# Patient Record
Sex: Female | Born: 1970 | Race: White | Hispanic: No | Marital: Married | Smoking: Never smoker
Health system: Southern US, Community
[De-identification: ages and names within clinical notes are randomized; demographics above are authoritative.]

## PROBLEM LIST (undated history)

## (undated) DIAGNOSIS — G8929 Other chronic pain: Secondary | ICD-10-CM

## (undated) DIAGNOSIS — R519 Headache, unspecified: Secondary | ICD-10-CM

## (undated) DIAGNOSIS — M4300 Spondylolysis, site unspecified: Secondary | ICD-10-CM

## (undated) HISTORY — DX: Headache, unspecified: R51.9

## (undated) HISTORY — DX: Spondylolysis, site unspecified: M43.00

## (undated) HISTORY — DX: Other chronic pain: G89.29

---

## 1996-01-07 HISTORY — PX: BUNIONECTOMY: SHX129

## 1998-02-14 ENCOUNTER — Ambulatory Visit (HOSPITAL_COMMUNITY): Admission: RE | Admit: 1998-02-14 | Discharge: 1998-02-14 | Payer: Self-pay | Admitting: Obstetrics and Gynecology

## 1998-06-22 ENCOUNTER — Inpatient Hospital Stay (HOSPITAL_COMMUNITY): Admission: AD | Admit: 1998-06-22 | Discharge: 1998-06-24 | Payer: Self-pay | Admitting: Obstetrics & Gynecology

## 2000-12-25 ENCOUNTER — Other Ambulatory Visit: Admission: RE | Admit: 2000-12-25 | Discharge: 2000-12-25 | Payer: Self-pay | Admitting: Obstetrics and Gynecology

## 2002-01-13 ENCOUNTER — Other Ambulatory Visit: Admission: RE | Admit: 2002-01-13 | Discharge: 2002-01-13 | Payer: Self-pay | Admitting: Obstetrics and Gynecology

## 2002-12-12 ENCOUNTER — Other Ambulatory Visit: Admission: RE | Admit: 2002-12-12 | Discharge: 2002-12-12 | Payer: Self-pay | Admitting: Obstetrics and Gynecology

## 2003-06-13 ENCOUNTER — Inpatient Hospital Stay (HOSPITAL_COMMUNITY): Admission: AD | Admit: 2003-06-13 | Discharge: 2003-06-16 | Payer: Self-pay | Admitting: Obstetrics and Gynecology

## 2003-07-25 ENCOUNTER — Other Ambulatory Visit: Admission: RE | Admit: 2003-07-25 | Discharge: 2003-07-25 | Payer: Self-pay | Admitting: Obstetrics and Gynecology

## 2004-05-08 ENCOUNTER — Other Ambulatory Visit: Admission: RE | Admit: 2004-05-08 | Discharge: 2004-05-08 | Payer: Self-pay | Admitting: Obstetrics and Gynecology

## 2004-11-18 ENCOUNTER — Other Ambulatory Visit: Admission: RE | Admit: 2004-11-18 | Discharge: 2004-11-18 | Payer: Self-pay | Admitting: Obstetrics and Gynecology

## 2013-01-06 HISTORY — PX: BUNIONECTOMY: SHX129

## 2014-02-28 ENCOUNTER — Ambulatory Visit (INDEPENDENT_AMBULATORY_CARE_PROVIDER_SITE_OTHER): Payer: 59

## 2014-02-28 DIAGNOSIS — R52 Pain, unspecified: Secondary | ICD-10-CM

## 2014-02-28 DIAGNOSIS — S92301S Fracture of unspecified metatarsal bone(s), right foot, sequela: Secondary | ICD-10-CM

## 2014-02-28 DIAGNOSIS — M778 Other enthesopathies, not elsewhere classified: Secondary | ICD-10-CM

## 2014-02-28 DIAGNOSIS — M7751 Other enthesopathy of right foot: Secondary | ICD-10-CM

## 2014-02-28 DIAGNOSIS — S92301G Fracture of unspecified metatarsal bone(s), right foot, subsequent encounter for fracture with delayed healing: Secondary | ICD-10-CM

## 2014-02-28 DIAGNOSIS — M779 Enthesopathy, unspecified: Secondary | ICD-10-CM

## 2014-02-28 NOTE — Patient Instructions (Signed)
Osteoarthritis Osteoarthritis is a disease that causes soreness and inflammation of a joint. It occurs when the cartilage at the affected joint wears down. Cartilage acts as a cushion, covering the ends of bones where they meet to form a joint. Osteoarthritis is the most common form of arthritis. It often occurs in older people. The joints affected most often by this condition include those in the:  Ends of the fingers.  Thumbs.  Neck.  Lower back.  Knees.  Hips. CAUSES  Over time, the cartilage that covers the ends of bones begins to wear away. This causes bone to rub on bone, producing pain and stiffness in the affected joints.  RISK FACTORS Certain factors can increase your chances of having osteoarthritis, including:  Older age.  Excessive body weight.  Overuse of joints.  Previous joint injury. SIGNS AND SYMPTOMS   Pain, swelling, and stiffness in the joint.  Over time, the joint may lose its normal shape.  Small deposits of bone (osteophytes) may grow on the edges of the joint.  Bits of bone or cartilage can break off and float inside the joint space. This may cause more pain and damage. DIAGNOSIS  Your health care provider will do a physical exam and ask about your symptoms. Various tests may be ordered, such as:  X-rays of the affected joint.  An MRI scan.  Blood tests to rule out other types of arthritis.  Joint fluid tests. This involves using a needle to draw fluid from the joint and examining the fluid under a microscope. TREATMENT  Goals of treatment are to control pain and improve joint function. Treatment plans may include:  A prescribed exercise program that allows for rest and joint relief.  A weight control plan.  Pain relief techniques, such as:  Properly applied heat and cold.  Electric pulses delivered to nerve endings under the skin (transcutaneous electrical nerve stimulation [TENS]).  Massage.  Certain nutritional  supplements.  Medicines to control pain, such as:  Acetaminophen.  Nonsteroidal anti-inflammatory drugs (NSAIDs), such as naproxen.  Narcotic or central-acting agents, such as tramadol.  Corticosteroids. These can be given orally or as an injection.  Surgery to reposition the bones and relieve pain (osteotomy) or to remove loose pieces of bone and cartilage. Joint replacement may be needed in advanced states of osteoarthritis. HOME CARE INSTRUCTIONS   Take medicines only as directed by your health care provider.  Maintain a healthy weight. Follow your health care provider's instructions for weight control. This may include dietary instructions.  Exercise as directed. Your health care provider can recommend specific types of exercise. These may include:  Strengthening exercises. These are done to strengthen the muscles that support joints affected by arthritis. They can be performed with weights or with exercise bands to add resistance.  Aerobic activities. These are exercises, such as brisk walking or low-impact aerobics, that get your heart pumping.  Range-of-motion activities. These keep your joints limber.  Balance and agility exercises. These help you maintain daily living skills.  Rest your affected joints as directed by your health care provider.  Keep all follow-up visits as directed by your health care provider. SEEK MEDICAL CARE IF:   Your skin turns red.  You develop a rash in addition to your joint pain.  You have worsening joint pain.  You have a fever along with joint or muscle aches. SEEK IMMEDIATE MEDICAL CARE IF:  You have a significant loss of weight or appetite.  You have night sweats. FOR MORE  INFORMATION   [employer] and Musculoskeletal and Skin Diseases: www.niams.http://www.myers.net/nih.gov  [employer] Aging: https://walker.com/www.nia.nih.gov  American College of Rheumatology: www.rheumatology.org Document Released: 12/23/2004 Document Revised:  05/09/2013 Document Reviewed: 08/30/2012 [facility] Patient Information 2015 [place], [place]. This information is not intended to replace advice given to you by your health care provider. Make sure you discuss any questions you have with your health care provider.   The fracture is lightly lead to a small amount of osteoarthritis and joint enlargement. This may subside over time, however may remain with some permanent enlargement or swelling of the joints. This can also lead to some stiffness and pain in the joints as well.

## 2014-02-28 NOTE — Progress Notes (Signed)
   Subjective:    Patient ID: Misty Torres, female    DOB: 03-04-1970, 44 y.o.   MRN: [id number]  HPI PT HAVE HISTORY OF BUNIONECTOMY DONE AND FRACTURE ON THE RT FOOT AND STILL HAVING PAIN FOR 9 MONTHS. THE FOOT IS BEEN THE SAME NOT WORSE AND GET AGGRAVATED BY WALKING. TRIED BOOT BUT NO HELP.   Review of Systems  Neurological: Positive for headaches.  All other systems reviewed and are negative.      Objective:   Physical Exam 44 year old female well-developed well-nourished oriented 3 presents this time for second opinion about surgery complication proxy 9 or 10 months ago underwent bunionectomy what appear to be a tailor bunionectomy as well as a scarf or a Z bunionectomy of the first metatarsal with 2 screw fixation subsequent Lee after surgery she developed a fracture the metatarsal base with possibly some displacement posse some loss of correction. There still appears to be some slight lucency in the metatarsal base area however for the most part appears to be [place] 80-90% consolidated. May be been some loss of correction as a result this as well as residual swelling and scar tissue and edema. Neurovascular status otherwise intact pedal pulses palpable epicritic appropriate septa sensations intact still has a slight abduction still some promise of the first MTP area tailor bunion appears to be adequately aligned.       Assessment & Plan:  Are assessment status post fracture closed fracture the metatarsal base first right sequela of possibly delayed union are healing residual scar tissue and mild arthropathy of the met cuneiform joint likely result of the fracture itself.. 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 Possibly some joint enlargement suggested monitoring for least another 6 months to see for complete residual healing if she continues to have some issues redo of surgery for additional correction may be appropriate however it will be her  decision should she choose to pursue that option at this time patient is doing all her activities with minimal restriction tabulating comfortably still some slight residual bunion deformity present although minimal minimal pain or discomfort on palpation range of motion some tenderness in the Lisfranc's first metatarsal base and cuneiform articulation noted. Follow-up in the future as needed  Harriet Masson DPM

## 2014-09-08 ENCOUNTER — Other Ambulatory Visit: Payer: Self-pay | Admitting: Obstetrics and Gynecology

## 2014-09-08 DIAGNOSIS — R928 Other abnormal and inconclusive findings on diagnostic imaging of breast: Secondary | ICD-10-CM

## 2014-10-04 ENCOUNTER — Other Ambulatory Visit: Payer: Self-pay

## 2014-10-11 ENCOUNTER — Ambulatory Visit
Admission: RE | Admit: 2014-10-11 | Discharge: 2014-10-11 | Disposition: A | Discharge status: Home / Self Care | Payer: 59 | Source: Ambulatory Visit | Attending: Obstetrics and Gynecology | Admitting: Obstetrics and Gynecology

## 2014-10-11 DIAGNOSIS — R928 Other abnormal and inconclusive findings on diagnostic imaging of breast: Secondary | ICD-10-CM

## 2015-03-06 ENCOUNTER — Other Ambulatory Visit: Payer: Self-pay | Admitting: Obstetrics and Gynecology

## 2015-03-06 DIAGNOSIS — N6489 Other specified disorders of breast: Secondary | ICD-10-CM

## 2015-03-06 DIAGNOSIS — N63 Unspecified lump in unspecified breast: Secondary | ICD-10-CM

## 2015-03-14 ENCOUNTER — Ambulatory Visit
Admission: RE | Admit: 2015-03-14 | Discharge: 2015-03-14 | Disposition: A | Discharge status: Home / Self Care | Payer: 59 | Source: Ambulatory Visit | Attending: Obstetrics and Gynecology | Admitting: Obstetrics and Gynecology

## 2015-03-14 DIAGNOSIS — N6489 Other specified disorders of breast: Secondary | ICD-10-CM

## 2015-08-29 ENCOUNTER — Other Ambulatory Visit: Payer: Self-pay | Admitting: Obstetrics and Gynecology

## 2015-08-29 DIAGNOSIS — N6489 Other specified disorders of breast: Secondary | ICD-10-CM

## 2015-09-26 ENCOUNTER — Ambulatory Visit
Admission: RE | Admit: 2015-09-26 | Discharge: 2015-09-26 | Disposition: A | Discharge status: Home / Self Care | Payer: 59 | Source: Ambulatory Visit | Attending: Obstetrics and Gynecology | Admitting: Obstetrics and Gynecology

## 2015-09-26 DIAGNOSIS — N6489 Other specified disorders of breast: Secondary | ICD-10-CM

## 2016-08-14 ENCOUNTER — Other Ambulatory Visit: Payer: Self-pay | Admitting: Obstetrics and Gynecology

## 2016-08-14 DIAGNOSIS — Z1231 Encounter for screening mammogram for malignant neoplasm of breast: Secondary | ICD-10-CM

## 2016-10-01 ENCOUNTER — Ambulatory Visit
Admission: RE | Admit: 2016-10-01 | Discharge: 2016-10-01 | Disposition: A | Discharge status: Home / Self Care | Payer: 59 | Source: Ambulatory Visit | Attending: Obstetrics and Gynecology | Admitting: Obstetrics and Gynecology

## 2016-10-01 DIAGNOSIS — Z1231 Encounter for screening mammogram for malignant neoplasm of breast: Secondary | ICD-10-CM

## 2017-08-24 ENCOUNTER — Other Ambulatory Visit: Payer: Self-pay | Admitting: Obstetrics and Gynecology

## 2017-08-24 DIAGNOSIS — Z1231 Encounter for screening mammogram for malignant neoplasm of breast: Secondary | ICD-10-CM

## 2017-10-07 ENCOUNTER — Ambulatory Visit
Admission: RE | Admit: 2017-10-07 | Discharge: 2017-10-07 | Disposition: A | Discharge status: Home / Self Care | Payer: 59 | Source: Ambulatory Visit | Attending: Obstetrics and Gynecology | Admitting: Obstetrics and Gynecology

## 2017-10-07 DIAGNOSIS — Z1231 Encounter for screening mammogram for malignant neoplasm of breast: Secondary | ICD-10-CM

## 2018-08-31 ENCOUNTER — Other Ambulatory Visit: Payer: Self-pay | Admitting: Obstetrics and Gynecology

## 2018-08-31 DIAGNOSIS — Z1231 Encounter for screening mammogram for malignant neoplasm of breast: Secondary | ICD-10-CM

## 2018-10-20 ENCOUNTER — Other Ambulatory Visit: Payer: Self-pay

## 2018-10-20 ENCOUNTER — Ambulatory Visit
Admission: RE | Admit: 2018-10-20 | Discharge: 2018-10-20 | Disposition: A | Discharge status: Home / Self Care | Payer: 59 | Source: Ambulatory Visit | Attending: Obstetrics and Gynecology | Admitting: Obstetrics and Gynecology

## 2018-10-20 DIAGNOSIS — Z1231 Encounter for screening mammogram for malignant neoplasm of breast: Secondary | ICD-10-CM

## 2019-09-13 ENCOUNTER — Other Ambulatory Visit: Payer: Self-pay | Admitting: Obstetrics and Gynecology

## 2019-09-13 DIAGNOSIS — Z1231 Encounter for screening mammogram for malignant neoplasm of breast: Secondary | ICD-10-CM

## 2019-10-21 ENCOUNTER — Other Ambulatory Visit: Payer: Self-pay

## 2019-10-21 ENCOUNTER — Ambulatory Visit
Admission: RE | Admit: 2019-10-21 | Discharge: 2019-10-21 | Disposition: A | Discharge status: Home / Self Care | Payer: 59 | Source: Ambulatory Visit | Attending: Obstetrics and Gynecology | Admitting: Obstetrics and Gynecology

## 2019-10-21 DIAGNOSIS — Z1231 Encounter for screening mammogram for malignant neoplasm of breast: Secondary | ICD-10-CM

## 2020-05-11 LAB — HM PAP SMEAR: HPV, high-risk: NEGATIVE

## 2020-09-13 ENCOUNTER — Other Ambulatory Visit: Payer: Self-pay | Admitting: Obstetrics and Gynecology

## 2020-09-13 DIAGNOSIS — Z1231 Encounter for screening mammogram for malignant neoplasm of breast: Secondary | ICD-10-CM

## 2020-10-02 ENCOUNTER — Other Ambulatory Visit: Payer: Self-pay | Admitting: Obstetrics and Gynecology

## 2020-10-02 DIAGNOSIS — Z8249 Family history of ischemic heart disease and other diseases of the circulatory system: Secondary | ICD-10-CM

## 2020-10-02 LAB — LAB REPORT - SCANNED
A1c: 5.4
EGFR: 78

## 2020-10-24 ENCOUNTER — Other Ambulatory Visit: Payer: Self-pay

## 2020-10-24 ENCOUNTER — Ambulatory Visit
Admission: RE | Admit: 2020-10-24 | Discharge: 2020-10-24 | Disposition: A | Discharge status: Home / Self Care | Payer: 59 | Source: Ambulatory Visit | Attending: Obstetrics and Gynecology | Admitting: Obstetrics and Gynecology

## 2020-10-24 DIAGNOSIS — Z1231 Encounter for screening mammogram for malignant neoplasm of breast: Secondary | ICD-10-CM

## 2020-10-30 ENCOUNTER — Ambulatory Visit
Admission: RE | Admit: 2020-10-30 | Discharge: 2020-10-30 | Disposition: A | Discharge status: Home / Self Care | Payer: No Typology Code available for payment source | Source: Ambulatory Visit | Attending: Obstetrics and Gynecology | Admitting: Obstetrics and Gynecology

## 2020-10-30 DIAGNOSIS — Z8249 Family history of ischemic heart disease and other diseases of the circulatory system: Secondary | ICD-10-CM

## 2021-02-07 ENCOUNTER — Encounter: Payer: Self-pay | Admitting: Obstetrics and Gynecology

## 2021-09-23 ENCOUNTER — Other Ambulatory Visit: Payer: Self-pay | Admitting: Obstetrics and Gynecology

## 2021-09-23 DIAGNOSIS — Z1231 Encounter for screening mammogram for malignant neoplasm of breast: Secondary | ICD-10-CM

## 2021-10-02 LAB — HM PAP SMEAR

## 2021-10-25 ENCOUNTER — Ambulatory Visit: Payer: 59

## 2021-10-31 LAB — LAB REPORT - SCANNED
A1c: 5.1
EGFR: 79

## 2021-12-10 ENCOUNTER — Ambulatory Visit: Payer: 59

## 2021-12-25 LAB — HM DEXA SCAN: HM Dexa Scan: NORMAL

## 2021-12-27 ENCOUNTER — Ambulatory Visit
Admission: RE | Admit: 2021-12-27 | Discharge: 2021-12-27 | Disposition: A | Discharge status: Home / Self Care | Payer: 59 | Source: Ambulatory Visit | Attending: Obstetrics and Gynecology | Admitting: Obstetrics and Gynecology

## 2021-12-27 DIAGNOSIS — Z1231 Encounter for screening mammogram for malignant neoplasm of breast: Secondary | ICD-10-CM

## 2021-12-27 LAB — HM MAMMOGRAPHY

## 2021-12-28 LAB — HM MAMMOGRAPHY: HM Mammogram: NORMAL (ref 0–4)

## 2022-01-29 ENCOUNTER — Encounter: Payer: 59 | Admitting: Gastroenterology

## 2022-06-01 IMAGING — CT CT CARDIAC CORONARY ARTERY CALCIUM SCORE
3 series · 14 of 20 positions shown, 16 images · non-contrast
Comparison: None.

CLINICAL DATA: 50-year-old Caucasian female with family history of
heart disease.

EXAM:
CT CARDIAC CORONARY ARTERY CALCIUM SCORE
TECHNIQUE: Non-contrast imaging through the heart was performed using
prospective ECG gating. Image post processing was performed on an
independent workstation, allowing for quantitative analysis of the
heart and coronary arteries. Note that this exam targets the heart
and the chest was not imaged in its entirety.

[Series 2: calcium scoring 2.00 qr36 bestdiast 70% hrt calciu · axial · 0.35mm/px · z∈[+1570,+1674]mm · 5 of 80 slices shown, 7 images]
[im 14/80  vessel]
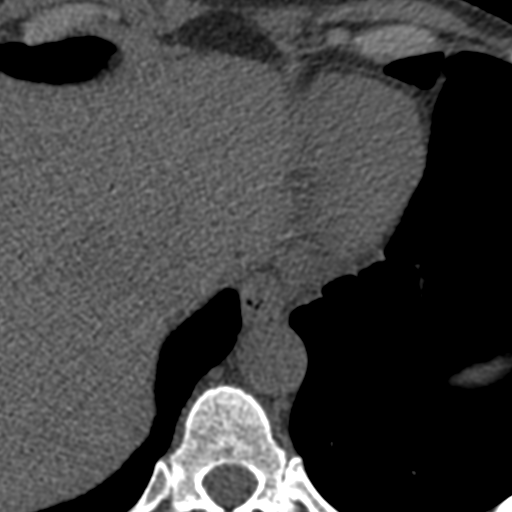
[im 14/80  lung]
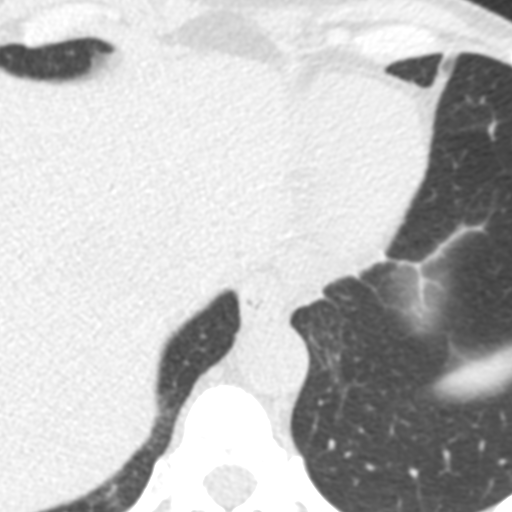
[im 27/80  vessel]
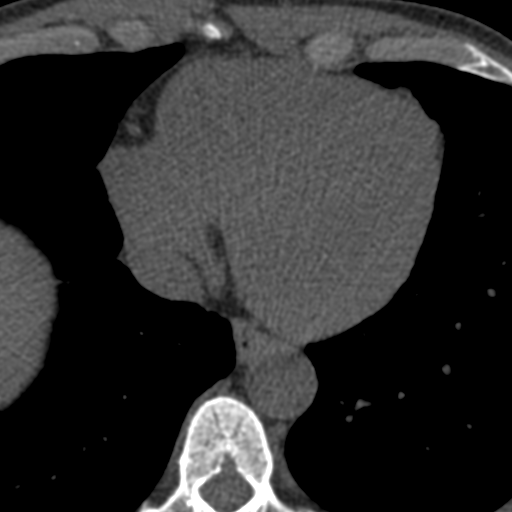
[im 40/80  vessel]
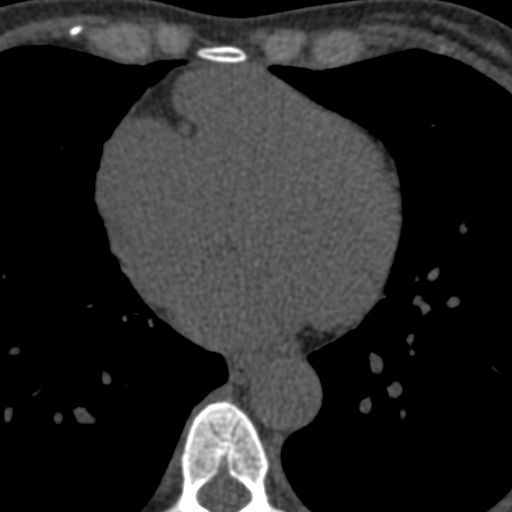
[im 53/80  vessel]
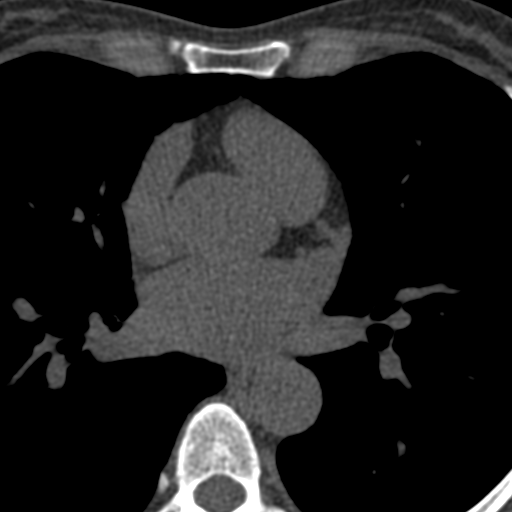
[im 66/80  vessel]
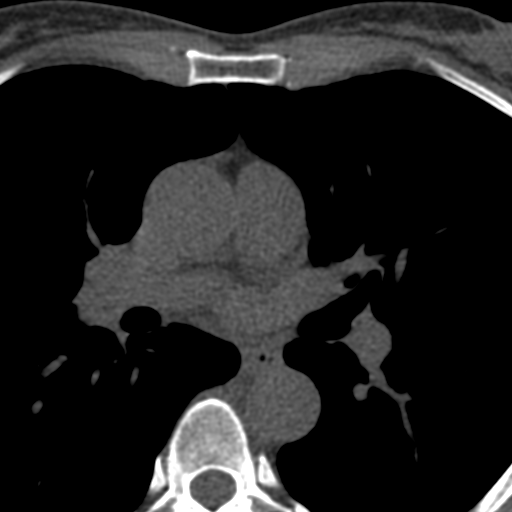
[im 66/80  lung]
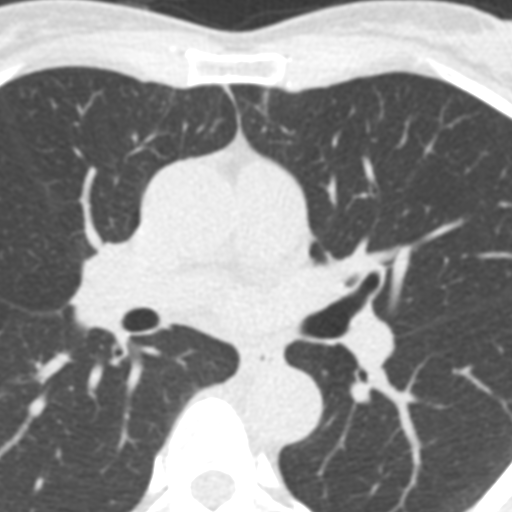

[Series 3: calcium scoring 2.00 br40 bestdiast 70% axial · axial · 0.47mm/px · z∈[+1574,+1668]mm · 4 of 77 slices shown]
[im 16/77  vessel]
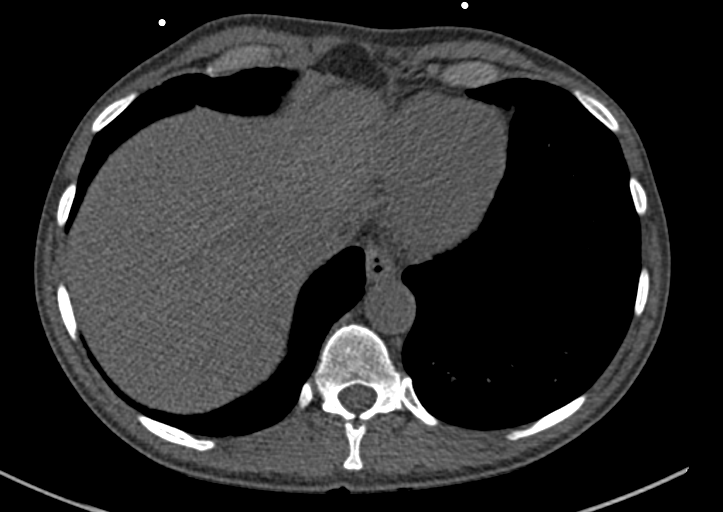
[im 31/77  vessel]
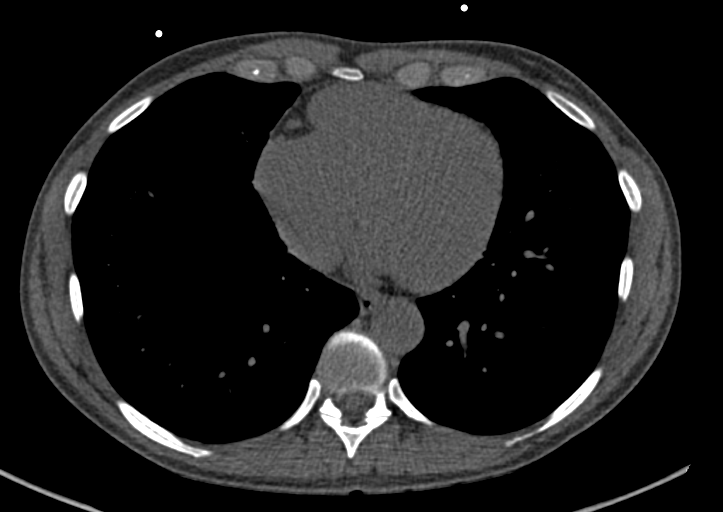
[im 46/77  vessel]
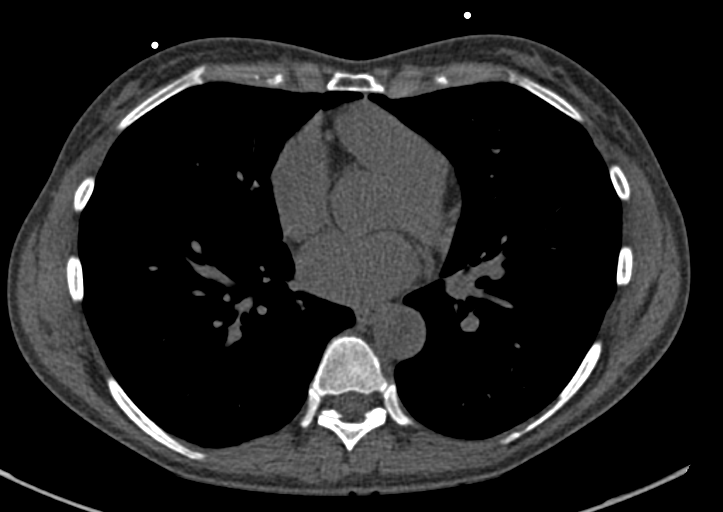
[im 61/77  vessel]
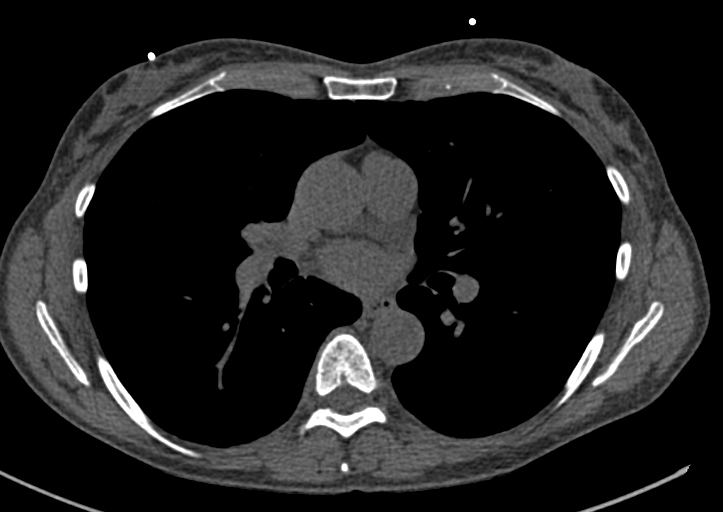

[Series 9: calcium scoring 2.00 br60 bestdiast 70% lungs · axial · 0.46mm/px · z∈[+1570,+1674]mm · 5 of 80 slices shown]
[im 14/80  vessel]
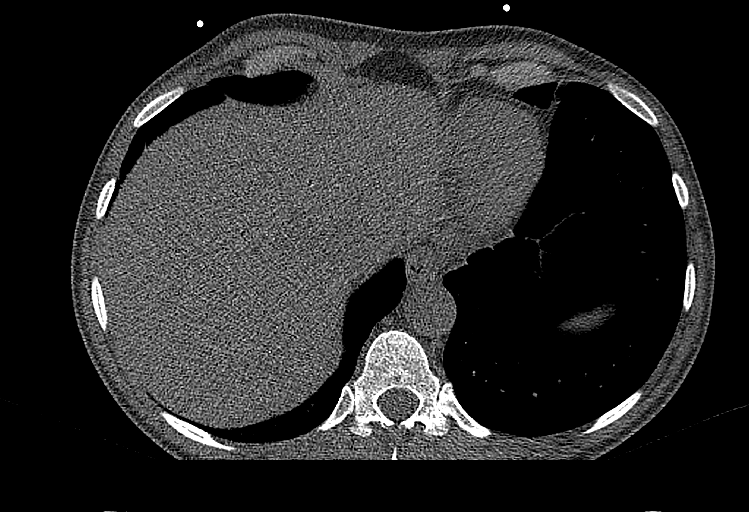
[im 27/80  vessel]
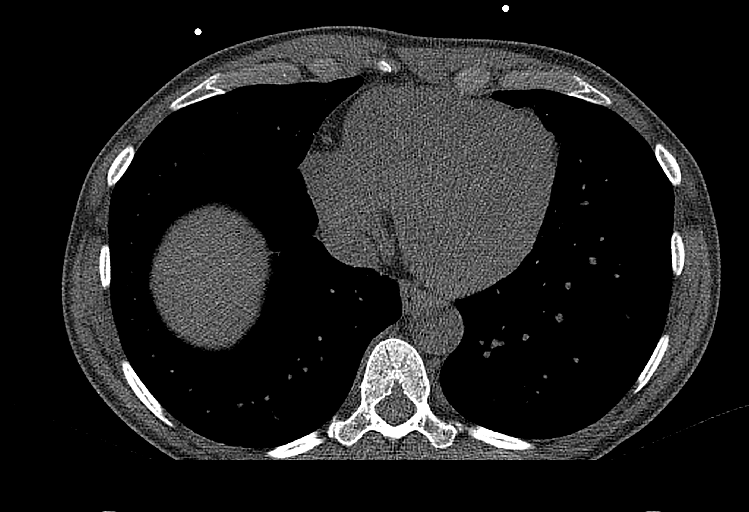
[im 40/80  vessel]
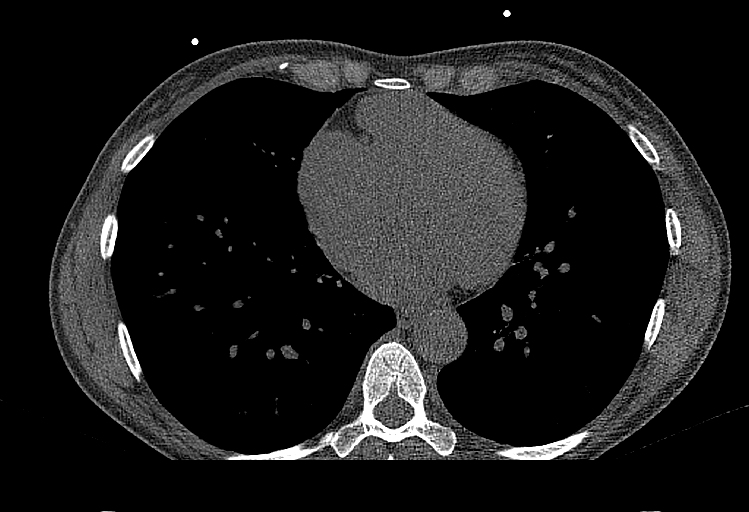
[im 53/80  vessel]
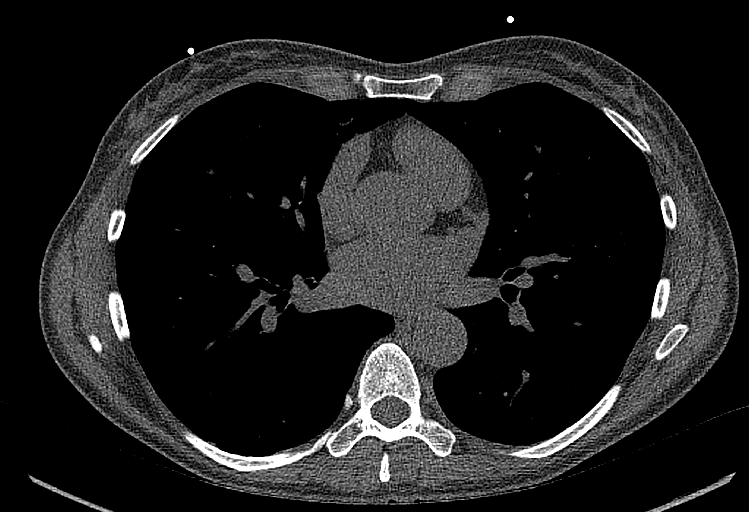
[im 66/80  vessel]
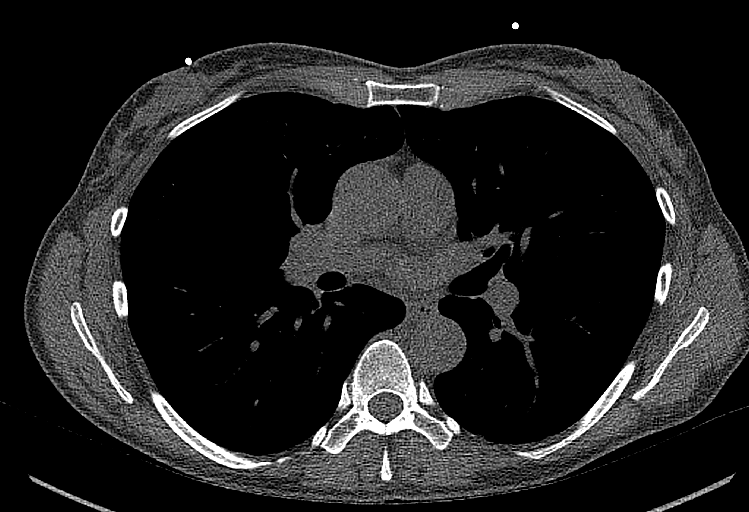

[14 of 20 positions shown; findings below may reference images not displayed]

FINDINGS: CORONARY CALCIUM SCORES:

Left Main: 0

LAD: 0

LCx: 0

RCA: 0

Total Agatston Score: 0

[HOSPITAL] percentile: 0

AORTA MEASUREMENTS:

Ascending Aorta: 32 mm

Descending Aorta: 24 mm

OTHER FINDINGS:


## 2022-09-29 ENCOUNTER — Other Ambulatory Visit: Payer: Self-pay | Admitting: Obstetrics and Gynecology

## 2022-09-29 DIAGNOSIS — Z1231 Encounter for screening mammogram for malignant neoplasm of breast: Secondary | ICD-10-CM

## 2022-10-07 LAB — LAB REPORT - SCANNED
A1c: 5.4
EGFR: 70

## 2022-10-16 ENCOUNTER — Encounter: Payer: Self-pay | Admitting: Family Medicine

## 2022-10-16 ENCOUNTER — Other Ambulatory Visit: Payer: Self-pay

## 2022-10-16 DIAGNOSIS — G43909 Migraine, unspecified, not intractable, without status migrainosus: Secondary | ICD-10-CM | POA: Insufficient documentation

## 2022-10-21 ENCOUNTER — Encounter: Payer: Self-pay | Admitting: Family Medicine

## 2022-10-21 ENCOUNTER — Ambulatory Visit: Payer: 59 | Admitting: Family Medicine

## 2022-10-21 VITALS — BP 104/82 | HR 67 | Temp 97.5°F | Ht 63.58 in | Wt 144.2 lb

## 2022-10-21 DIAGNOSIS — Z23 Encounter for immunization: Secondary | ICD-10-CM

## 2022-10-21 DIAGNOSIS — N951 Menopausal and female climacteric states: Secondary | ICD-10-CM

## 2022-10-21 DIAGNOSIS — Z7689 Persons encountering health services in other specified circumstances: Secondary | ICD-10-CM

## 2022-10-21 DIAGNOSIS — Z9889 Other specified postprocedural states: Secondary | ICD-10-CM

## 2022-10-21 DIAGNOSIS — M4300 Spondylolysis, site unspecified: Secondary | ICD-10-CM | POA: Insufficient documentation

## 2022-10-21 HISTORY — DX: Other specified postprocedural states: Z98.890

## 2022-10-21 NOTE — Progress Notes (Signed)
Patient ID: Misty Torres, female  DOB: 1970-11-05, 52 y.o.   MRN: [id number] Patient Care Team    Relationship Specialty Notifications Start End  Natalia Leatherwood, DO PCP - General Family Medicine  10/21/22   Richardean Chimera, MD Consulting Physician Obstetrics and Gynecology  10/21/22     Chief Complaint  Patient presents with   Establish Care    CPE with GYN Sept 2024; testosterone and cortisol; pt is fasting    Subjective: Misty Torres is a 52 y.o.  female present for new patient establishment. All past medical history, surgical history, allergies, family history, immunizations, medications and social history were updated in the electronic medical record today. All recent labs, ED visits and hospitalizations within the last year were reviewed.  Patient presents today for establishment.  She would like to start the Shingrix vaccine series. She also has concerns over menopausal symptoms.  She endorses hot flashes, difficulty losing weight.  She reports she would like to have her testosterone and cortisol levels evaluated.  She had her annual physical with her gynecologist which completed lab work a few weeks ago, but testosterone cortisol was not checked.     10/21/2022   10:07 AM  Depression screen PHQ 2/9  Decreased Interest 0  Down, Depressed, Hopeless 0  PHQ - 2 Score 0       No data to display                10/21/2022   10:07 AM  Fall Risk   Falls in the past year? 0  Number falls in past yr: 0  Injury with Fall? 0  Risk for fall due to : No Fall Risks  Follow up Falls evaluation completed   Immunization History  Administered Date(s) Administered   Influenza Inj Mdck Quad Pf 10/30/2021   Influenza-Unspecified 10/07/2022   MMR 11/12/2017   Td 10/03/2014   Unspecified SARS-COV-2 Vaccination 01/10/2019, 02/10/2019    No results found.  Past Medical History:  Diagnosis Date   Chronic headaches    History of loop electrosurgical excision procedure (LEEP)  10/21/2022   2021     Spondylolysis    No Known Allergies Past Surgical History:  Procedure Laterality Date   BUNIONECTOMY Left 1998   BUNIONECTOMY Right 2015   Family History  Problem Relation Age of Onset   Osteoarthritis Mother    Hypertension Father    Hyperlipidemia Father    Diabetes Father    Osteoarthritis Father    Heart attack Father    Hearing loss Maternal Grandmother    Hearing loss Maternal Grandfather    Heart attack Maternal Grandfather    Heart disease Maternal Grandfather    Hypertension Maternal Grandfather    Osteoarthritis Paternal Grandmother    Stroke Paternal Grandfather    Alcohol abuse Paternal Grandfather    Osteoarthritis Paternal Grandfather    Hypertension Paternal Grandfather    Asthma Daughter    Asthma Son    Social History   Social History Narrative   Marital status/children/pets: married, G3P3   Education/employment: BSN, school nurse   Safety:      -Wears a bicycle helmet riding a bike: Yes     -smoke alarm in the home:Yes     - wears seatbelt: Yes     - Feels safe in their relationships: Yes       Allergies as of 10/21/2022   No Known Allergies      Medication List  Accurate as of October 21, 2022 10:29 AM. If you have any questions, ask your nurse or doctor.          STOP taking these medications    ibuprofen 200 MG tablet Commonly known as: ADVIL Stopped by: Felix Pacini       TAKE these medications    estradiol 1 MG tablet Commonly known as: ESTRACE Take 1 tablet by mouth daily.   meloxicam 15 MG tablet Commonly known as: MOBIC Take 1 tablet by mouth daily.   progesterone 100 MG capsule Commonly known as: PROMETRIUM TAKE 1 CAPSULE BY MOUTH EVERY DAY AT BEDTIME FOR 30 DAYS        All past medical history, surgical history, allergies, family history, immunizations andmedications were updated in the EMR today and reviewed under the history and medication portions of their EMR.    Recent  Results (from the past 2160 hour(s))  Lab report - scanned     Status: None   Collection Time: 10/07/22 12:00 AM  Result Value Ref Range   A1c 5.4     Comment: Abstracted by HIM   EGFR 70.0     Comment: Abstracted by HIM     ROS 14 pt review of systems performed and negative (unless mentioned in an HPI)  Objective: BP 104/82   Pulse 67   Temp (!) 97.5 F (36.4 C)   Ht 5' 3.58" (1.615 m)   Wt 144 lb 3.2 oz (65.4 kg)   SpO2 98%   BMI 25.08 kg/m  Physical Exam Vitals and nursing note reviewed.  Constitutional:      General: She is not in acute distress.    Appearance: Normal appearance. She is not ill-appearing, toxic-appearing or diaphoretic.  HENT:     Head: Normocephalic and atraumatic.  Eyes:     General: No scleral icterus.       Right eye: No discharge.        Left eye: No discharge.     Extraocular Movements: Extraocular movements intact.     Conjunctiva/sclera: Conjunctivae normal.     Pupils: Pupils are equal, round, and reactive to light.  Cardiovascular:     Rate and Rhythm: Normal rate and regular rhythm.  Pulmonary:     Effort: Pulmonary effort is normal. No respiratory distress.     Breath sounds: Normal breath sounds. No wheezing, rhonchi or rales.  Musculoskeletal:     Right lower leg: No edema.     Left lower leg: No edema.  Skin:    General: Skin is warm.     Findings: No rash.  Neurological:     Mental Status: She is alert and oriented to person, place, and time. Mental status is at baseline.     Motor: No weakness.     Gait: Gait normal.  Psychiatric:        Mood and Affect: Mood normal.        Behavior: Behavior normal.        Thought Content: Thought content normal.        Judgment: Judgment normal.       Assessment/plan: Misty Torres is a 52 y.o. female present for est care  Need for zoster vaccine - Varicella-zoster vaccine IM menopause After discussion elected not to perform cortisol levels.  Encouraged to check with her  insurance to see if they will cover testosterone lab.  If she decides to get level she understands it needs to be completed within a week and at  8 am.  - Testosterone; Future  Return in about 1 year (around 10/07/2023).  Orders Placed This Encounter  Procedures   Testosterone   No orders of the defined types were placed in this encounter.  Referral Orders  No referral(s) requested today     Note is dictated utilizing voice recognition software. Although note has been proof read prior to signing, occasional typographical errors still can be missed. If any questions arise, please do not hesitate to call for verification.  Electronically signed by: Felix Pacini, DO [facility] [place]

## 2022-10-21 NOTE — Patient Instructions (Addendum)
Return in about 1 year (around 10/07/2023).   -3 mos for nurse visit- shingrix #2 -Lab appt only within 1 week at 8 am.      Randie Heinz to see you today.  I have refilled the medication(s) we provide.   If labs were collected or images ordered, we will inform you of  results once we have received them and reviewed. We will contact you either by echart message, or telephone call.  Please give ample time to the testing facility, and our office to run,  receive and review results. Please do not call inquiring of results, even if you can see them in your chart. We will contact you as soon as we are able. If it has been over 1 week since the test was completed, and you have not yet heard from [place], then please call us.    - echart message- for normal results that have been seen by the patient already.   - telephone call: abnormal results or if patient has not viewed results in their echart.  If a referral to a specialist was entered for you, please call us in 2 weeks if you have not heard from the specialist office to schedule.

## 2022-10-24 ENCOUNTER — Encounter: Payer: Self-pay | Admitting: Family Medicine

## 2022-10-29 ENCOUNTER — Telehealth: Payer: Self-pay

## 2022-10-29 ENCOUNTER — Other Ambulatory Visit (INDEPENDENT_AMBULATORY_CARE_PROVIDER_SITE_OTHER): Payer: 59

## 2022-10-29 ENCOUNTER — Other Ambulatory Visit: Payer: 59

## 2022-10-29 DIAGNOSIS — N951 Menopausal and female climacteric states: Secondary | ICD-10-CM

## 2022-10-29 LAB — CORTISOL: Cortisol, Plasma: 5.7 ug/dL

## 2022-10-29 LAB — TESTOSTERONE: Testosterone: 12.81 ng/dL — ABNORMAL LOW (ref 15.00–40.00)

## 2022-10-29 NOTE — Addendum Note (Signed)
Addended by: Filomena Jungling on: 10/29/2022 11:39 AM   Modules accepted: Orders

## 2022-10-29 NOTE — Telephone Encounter (Signed)
A user error has taken place: encounter opened in error, closed for administrative reasons.

## 2022-10-30 ENCOUNTER — Encounter: Payer: Self-pay | Admitting: Family Medicine

## 2022-10-30 DIAGNOSIS — Z780 Asymptomatic menopausal state: Secondary | ICD-10-CM | POA: Insufficient documentation

## 2022-11-07 LAB — EXTERNAL GENERIC LAB PROCEDURE: COLOGUARD: NEGATIVE

## 2022-11-07 LAB — COLOGUARD: COLOGUARD: NEGATIVE

## 2022-12-29 ENCOUNTER — Inpatient Hospital Stay
Admission: RE | Admit: 2022-12-29 | Discharge: 2022-12-29 | Discharge status: Home / Self Care | Payer: 59 | Source: Ambulatory Visit | Attending: Obstetrics and Gynecology | Admitting: Obstetrics and Gynecology

## 2022-12-29 ENCOUNTER — Ambulatory Visit: Payer: 59

## 2022-12-29 DIAGNOSIS — Z1231 Encounter for screening mammogram for malignant neoplasm of breast: Secondary | ICD-10-CM

## 2023-01-21 ENCOUNTER — Ambulatory Visit: Payer: 59

## 2023-01-28 ENCOUNTER — Ambulatory Visit: Payer: 59

## 2023-02-03 ENCOUNTER — Ambulatory Visit (INDEPENDENT_AMBULATORY_CARE_PROVIDER_SITE_OTHER): Payer: 59

## 2023-02-03 DIAGNOSIS — Z23 Encounter for immunization: Secondary | ICD-10-CM

## 2023-05-27 ENCOUNTER — Telehealth: Payer: Self-pay | Admitting: Family Medicine

## 2023-05-27 NOTE — Telephone Encounter (Signed)
 Copied from CRM [id number]. Topic: Clinical - Request for Lab/Test Order >> May 27, 2023  9:45 AM Danae Duncans wrote: Reason for CRM: Pt advise she see provider at [facility] and they requesting she get labs to check liver/kidney functions since she takes meloxicam, pt does has the paper lab order with her, she would like to know if it possible to take labs there at clinic - pt can be reached [phone]

## 2023-06-19 NOTE — Telephone Encounter (Signed)
 Please inform patient we cannot accommodate her with the request of [facility] labs.  This office is not a lab draw station and we can only draw labs for [facility] providers.  I recommend she take it to a [facility]/lab draw station

## 2023-06-19 NOTE — Telephone Encounter (Signed)
 Pt called to follow up to see if she can have her labs done at this office from [facility]. Pt has a printed copy of lab orders.

## 2023-06-19 NOTE — Telephone Encounter (Signed)
 Pt advised.

## 2023-09-22 ENCOUNTER — Telehealth: Payer: Self-pay

## 2023-09-22 LAB — LIPID PANEL
Cholesterol: 173 (ref 0–200)
HDL: 90 — AB (ref 35–70)
LDL Cholesterol: 71
LDl/HDL Ratio: 0.8
Triglycerides: 64 (ref 40–160)

## 2023-09-22 LAB — BASIC METABOLIC PANEL WITH GFR
BUN: 13 (ref 4–21)
CO2: 23 — AB (ref 13–22)
Chloride: 102 (ref 99–108)
Creatinine: 0.9 (ref 0.5–1.1)
Glucose: 98
Potassium: 4.9 meq/L (ref 3.5–5.1)
Sodium: 140 (ref 137–147)

## 2023-09-22 LAB — COMPREHENSIVE METABOLIC PANEL WITH GFR
Albumin: 4.4 (ref 3.5–5.0)
Calcium: 9.9 (ref 8.7–10.7)
Globulin: 2.2
eGFR: 75

## 2023-09-22 LAB — TSH: TSH: 0.96 (ref 0.41–5.90)

## 2023-09-22 LAB — HEPATIC FUNCTION PANEL
ALT: 13 U/L (ref 7–35)
AST: 18 (ref 13–35)
Alkaline Phosphatase: 44 (ref 25–125)
Bilirubin, Total: 0.4

## 2023-09-22 LAB — HEMOGLOBIN A1C: Hemoglobin A1C: 5.2

## 2023-09-22 NOTE — Telephone Encounter (Signed)
    MEDICAL RECORDS REQUEST FOR CONTINUITY OF CARE Please Return Records with This Cover Sheet~ Return Fax to [phone]  **Please send most recent records**  x Mammogram []  Biopsy []  Ultrasound x Colonoscopy   (Pathology)                        (Pathology, report, & letter)  x Lab Results []  MRI []  CT []  Pet Scan   [] Other:______________________________________________________________  **CONFIDENTIALITY NOTICE** [facility] protects the privacy and confidentiality of all patients' protected health information (PHI) in any form or format, oral, written, or electronic in accordance with applicable state and federal laws and regulations. [facility] uses and discloses PHI in accordance with state and federal laws and regulations, and abides by patient privacy rights. The above medical records are being requested under 45 C.F.R. 164.506 of the HIPAA Privacy Rule which permits covered entities to share PHI without obtaining written authorization.  This message is intended only for the use of the individual or entity to which it is addressed, and may contain information that is privileged, confidential, and exempt from disclosure under applicable law. If the reader of this message is not the intended recipient, you are hereby notified that any dissemination, distribution, or copying of this communication is strictly prohibited. If you have received this communication in error, please notify our Compliance & Privacy Helpline at [phone].  IF TRANSMITTAL IS NOT RECEIVED COMPLETE, PLEASE NOTIFY THE SENDER IMMEDIATELY. THANK YOU.   MEDICAL RECORDS REQUEST FOR CONTINUITY OF CARE Please Return Records with This Cover Sheet~ Return Fax to [phone]  **Please send most recent records**  [x]  Mammogram []  Biopsy []  Ultrasound []  Colonoscopy   (Pathology)                        (Pathology, report, & letter)  [x]  Lab Results []  MRI []  CT []  Pet  Scan   [] Other:______________________________________________________________  **CONFIDENTIALITY NOTICE** [facility] protects the privacy and confidentiality of all patients' protected health information (PHI) in any form or format, oral, written, or electronic in accordance with applicable state and federal laws and regulations. [facility] uses and discloses PHI in accordance with state and federal laws and regulations, and abides by patient privacy rights. The above medical records are being requested under 45 C.F.R. 164.506 of the HIPAA Privacy Rule which permits covered entities to share PHI without obtaining written authorization.  This message is intended only for the use of the individual or entity to which it is addressed, and may contain information that is privileged, confidential, and exempt from disclosure under applicable law. If the reader of this message is not the intended recipient, you are hereby notified that any dissemination, distribution, or copying of this communication is strictly prohibited. If you have received this communication in error, please notify our Compliance & Privacy Helpline at [phone].  IF TRANSMITTAL IS NOT RECEIVED COMPLETE, PLEASE NOTIFY THE SENDER IMMEDIATELY. THANK YOU.

## 2023-09-22 NOTE — Telephone Encounter (Signed)
 Copied from CRM [id number]. Topic: Appointments - Scheduling Inquiry for Clinic >> Sep 21, 2023 12:43 PM Mesmerise C wrote: Reason for CRM: Patient inquiring if blood work will be done in the morning during her physical so she knows she can have it done at her gynecology appointment tomorrow if so requesting a chem profile, a1c, thyroid panel, PSH, testosterone , and cardiac enzymes to be checked would like to be let known before the end of today before her gynecology appointment tomorrow at [facility] >> Sep 22, 2023  8:17 AM [place] E wrote: Patient called back stating she will have these labs completed at her gynecology appointment this morning, and is hoping her PCP will be able to view them before her physical appointment.

## 2023-09-23 ENCOUNTER — Encounter: Payer: Self-pay | Admitting: Family Medicine

## 2023-09-23 ENCOUNTER — Ambulatory Visit (INDEPENDENT_AMBULATORY_CARE_PROVIDER_SITE_OTHER): Payer: 59 | Admitting: Family Medicine

## 2023-09-23 VITALS — BP 112/74 | HR 66 | Temp 97.8°F | Ht 63.0 in | Wt 141.0 lb

## 2023-09-23 DIAGNOSIS — Z1231 Encounter for screening mammogram for malignant neoplasm of breast: Secondary | ICD-10-CM | POA: Diagnosis not present

## 2023-09-23 DIAGNOSIS — Z23 Encounter for immunization: Secondary | ICD-10-CM | POA: Diagnosis not present

## 2023-09-23 DIAGNOSIS — Z1159 Encounter for screening for other viral diseases: Secondary | ICD-10-CM

## 2023-09-23 DIAGNOSIS — Z131 Encounter for screening for diabetes mellitus: Secondary | ICD-10-CM

## 2023-09-23 DIAGNOSIS — Z0000 Encounter for general adult medical examination without abnormal findings: Secondary | ICD-10-CM | POA: Diagnosis not present

## 2023-09-23 DIAGNOSIS — Z1211 Encounter for screening for malignant neoplasm of colon: Secondary | ICD-10-CM

## 2023-09-23 DIAGNOSIS — E663 Overweight: Secondary | ICD-10-CM | POA: Insufficient documentation

## 2023-09-23 DIAGNOSIS — Z13220 Encounter for screening for lipoid disorders: Secondary | ICD-10-CM

## 2023-09-23 DIAGNOSIS — Z114 Encounter for screening for human immunodeficiency virus [HIV]: Secondary | ICD-10-CM

## 2023-09-23 LAB — CBC
HCT: 41.1 % (ref 36.0–46.0)
Hemoglobin: 13.7 g/dL (ref 12.0–15.0)
MCHC: 33.3 g/dL (ref 30.0–36.0)
MCV: 94.3 fl (ref 78.0–100.0)
Platelets: 343 K/uL (ref 150.0–400.0)
RBC: 4.36 Mil/uL (ref 3.87–5.11)
RDW: 12.9 % (ref 11.5–15.5)
WBC: 4 K/uL (ref 4.0–10.5)

## 2023-09-23 NOTE — Progress Notes (Signed)
 Patient ID: Misty Torres, female  DOB: 03-19-1970, 53 y.o.   MRN: [id number] Patient Care Team    Relationship Specialty Notifications Start End  Catherine Charlies LABOR, DO PCP - General Family Medicine  10/21/22   Leva Rush, MD Consulting Physician Obstetrics and Gynecology  10/21/22     Chief Complaint  Patient presents with   Annual Exam    Prevnar 20   influenza vaccine Pt is fasting.     Subjective:  Misty Torres is a 53 y.o.  Female  present for CPE . All past medical history, surgical history, allergies, family history, immunizations, medications and social history were updated in the electronic medical record today. All recent labs, ED visits and hospitalizations within the last year were reviewed.  Health maintenance:  Colon cancer screening: Cologuard - 10/28/2022 repeat 3 years:  Mammogram: completed: 12/29/2022, [facility]-ordered by GYN Cervical cancer screening: last pap: [facility] Immunizations: tdap UTD 10/03/2014, Influenza completed today (encouraged yearly), Prevnar 20 administered , Shingrix  series completed Infectious disease screening: HIV and Hep C collected today- pt agreeable to screen DEXA: Routine screening to start at 60 Patient has a Dental home. Hospitalizations/ED visits: Reviewed  Reviewed labs collected at GYN office yesterday, cmp, tsh, lipid, A1c wnl.     09/23/2023    8:40 AM 10/21/2022   10:07 AM  Depression screen PHQ 2/9  Decreased Interest 0 0  Down, Depressed, Hopeless 0 0  PHQ - 2 Score 0 0       No data to display            10/21/2022   10:07 AM 09/23/2023    8:40 AM  Fall Risk  Falls in the past year? 0 0  Was there an injury with Fall? 0 0  Fall Risk Category Calculator 0 0  Patient at Risk for Falls Due to No Fall Risks No Fall Risks  Fall risk Follow up Falls evaluation completed Falls evaluation completed     Immunization History  Administered Date(s) Administered    Influenza Inj Mdck Quad Pf 10/30/2021   Influenza, Seasonal, Injecte, Preservative Fre 09/23/2023   Influenza-Unspecified 10/07/2022   MMR 11/12/2017   PNEUMOCOCCAL CONJUGATE-20 09/23/2023   Td 10/03/2014   Unspecified SARS-COV-2 Vaccination 01/10/2019, 02/10/2019   Zoster Recombinant(Shingrix ) 10/21/2022, 02/03/2023     Past Medical History:  Diagnosis Date   Chronic headaches    History of loop electrosurgical excision procedure (LEEP) 10/21/2022   2021     Spondylolysis    No Known Allergies Past Surgical History:  Procedure Laterality Date   BUNIONECTOMY Left 1998   BUNIONECTOMY Right 2015   Family History  Problem Relation Age of Onset   Osteoarthritis Mother    Hypertension Father    Hyperlipidemia Father    Diabetes Father    Osteoarthritis Father    Heart attack Father    Hearing loss Maternal Grandmother    Hearing loss Maternal Grandfather    Heart attack Maternal Grandfather    Heart disease Maternal Grandfather    Hypertension Maternal Grandfather    Osteoarthritis Paternal Grandmother    Stroke Paternal Grandfather    Alcohol abuse Paternal Grandfather    Osteoarthritis Paternal Grandfather    Hypertension Paternal Grandfather    Asthma Daughter    Asthma Son    Social History   Social History Narrative   Marital status/children/pets: married, G3P3   Education/employment: Scientist, research (physical sciences), school nurse   Safety:      -  Wears a bicycle helmet riding a bike: Yes     -smoke alarm in the home:Yes     - wears seatbelt: Yes     - Feels safe in their relationships: Yes       Allergies as of 09/23/2023   No Known Allergies      Medication List        Accurate as of September 23, 2023  8:49 AM. If you have any questions, ask your nurse or doctor.          STOP taking these medications    meloxicam 15 MG tablet Commonly known as: MOBIC Stopped by: Charlies Bellini       TAKE these medications    estradiol 1 MG tablet Commonly known as:  ESTRACE Take 1 tablet by mouth daily.   progesterone 100 MG capsule Commonly known as: PROMETRIUM TAKE 1 CAPSULE BY MOUTH EVERY DAY AT BEDTIME FOR 30 DAYS        All past medical history, surgical history, allergies, family history, immunizations andmedications were updated in the EMR today and reviewed under the history and medication portions of their EMR.     No results found for this or any previous visit (from the past 2160 hours).  MM 3D SCREENING MAMMOGRAM BILATERAL BREAST Result Date: 01/02/2023 CLINICAL DATA:  Screening. EXAM: DIGITAL SCREENING BILATERAL MAMMOGRAM WITH TOMOSYNTHESIS AND CAD TECHNIQUE: Bilateral screening digital craniocaudal and mediolateral oblique mammograms were obtained. Bilateral screening digital breast tomosynthesis was performed. The images were evaluated with computer-aided detection. COMPARISON:  Previous exam(s). ACR Breast Density Category d: The breasts are extremely dense, which lowers the sensitivity of mammography. FINDINGS: There are no findings suspicious for malignancy. IMPRESSION: No mammographic evidence of malignancy. A result letter of this screening mammogram will be mailed directly to the patient. RECOMMENDATION: Screening mammogram in one year. (Code:SM-B-01Y) BI-RADS CATEGORY  1: Negative. Electronically Signed   By: Rosaline Collet M.D.   On: 01/02/2023 17:25     ROS 14 pt review of systems performed and negative (unless mentioned in an HPI)  Objective:  BP 112/74   Pulse 66   Temp 97.8 F (36.6 C)   Ht 5' 3 (1.6 m)   Wt 141 lb (64 kg)   SpO2 100%   BMI 24.98 kg/m   Physical Exam Vitals and nursing note reviewed.  Constitutional:      General: She is not in acute distress.    Appearance: Normal appearance. She is not ill-appearing or toxic-appearing.  HENT:     Head: Normocephalic and atraumatic.     Right Ear: Tympanic membrane, ear canal and external ear normal. There is no impacted cerumen.     Left Ear: Tympanic  membrane, ear canal and external ear normal. There is no impacted cerumen.     Nose: No congestion or rhinorrhea.     Mouth/Throat:     Mouth: Mucous membranes are moist.     Pharynx: Oropharynx is clear. No oropharyngeal exudate or posterior oropharyngeal erythema.  Eyes:     General: No scleral icterus.       Right eye: No discharge.        Left eye: No discharge.     Extraocular Movements: Extraocular movements intact.     Conjunctiva/sclera: Conjunctivae normal.     Pupils: Pupils are equal, round, and reactive to light.  Cardiovascular:     Rate and Rhythm: Normal rate and regular rhythm.     Pulses: Normal pulses.  Heart sounds: Normal heart sounds. No murmur heard.    No friction rub. No gallop.  Pulmonary:     Effort: Pulmonary effort is normal. No respiratory distress.     Breath sounds: Normal breath sounds. No stridor. No wheezing, rhonchi or rales.  Chest:     Chest wall: No tenderness.  Abdominal:     General: Abdomen is flat. Bowel sounds are normal. There is no distension.     Palpations: Abdomen is soft. There is no mass.     Tenderness: There is no abdominal tenderness. There is no right CVA tenderness, left CVA tenderness, guarding or rebound.     Hernia: No hernia is present.  Musculoskeletal:        General: No swelling, tenderness or deformity. Normal range of motion.     Cervical back: Normal range of motion and neck supple. No rigidity or tenderness.     Right lower leg: No edema.     Left lower leg: No edema.  Lymphadenopathy:     Cervical: No cervical adenopathy.  Skin:    General: Skin is warm and dry.     Coloration: Skin is not jaundiced or pale.     Findings: No bruising, erythema, lesion or rash.  Neurological:     General: No focal deficit present.     Mental Status: She is alert and oriented to person, place, and time. Mental status is at baseline.     Cranial Nerves: No cranial nerve deficit.     Sensory: No sensory deficit.     Motor: No  weakness.     Coordination: Coordination normal.     Gait: Gait normal.     Deep Tendon Reflexes: Reflexes normal.  Psychiatric:        Mood and Affect: Mood normal.        Behavior: Behavior normal.        Thought Content: Thought content normal.        Judgment: Judgment normal.        No results found.  Assessment/plan: MIAMARIE MOLL is a 53 y.o. female present for CPE  Influenza vaccine needed - Flu vaccine trivalent PF, 6mos and older(Flulaval,Afluria,Fluarix,Fluzone) Encounter for hepatitis C screening test for low risk patient - Hepatitis C Antibody Need for vaccination for pneumococcus - Pneumococcal conjugate vaccine 20-valent Encounter for screening for HIV - HIV antibody (with reflex) Routine general medical examination at a health care facility (Primary) - CBC Patient was encouraged to exercise greater than 150 minutes a week. Patient was encouraged to choose a diet filled with fresh fruits and vegetables, and lean meats. AVS provided to patient today for education/recommendation on gender specific health and safety maintenance. Colon cancer screening: Cologuard - 10/28/2022 repeat 3 years:  Mammogram: completed: 12/29/2022, [facility]-ordered by GYN Cervical cancer screening: last [facility] Immunizations: tdap UTD 10/03/2014, Influenza completed today (encouraged yearly), Prevnar 20 administered , Shingrix  series completed Infectious disease screening: HIV and Hep C collected today- pt agreeable to screen DEXA: Routine screening to start at 60  Return in about 1 year (around 09/23/2024) for cpe (20 min).  Orders Placed This Encounter  Procedures   Flu vaccine trivalent PF, 6mos and older(Flulaval,Afluria,Fluarix,Fluzone)   Pneumococcal conjugate vaccine 20-valent   CBC   Hepatitis C Antibody   HIV antibody (with reflex)    Orders Placed This Encounter  Procedures   Flu vaccine trivalent PF, 6mos and  older(Flulaval,Afluria,Fluarix,Fluzone)   Pneumococcal conjugate vaccine 20-valent  CBC   Hepatitis C Antibody   HIV antibody (with reflex)   No orders of the defined types were placed in this encounter.  Referral Orders  No referral(s) requested today     Electronically signed by: Charlies Bellini, DO [facility]

## 2023-09-23 NOTE — Patient Instructions (Addendum)

## 2023-09-24 ENCOUNTER — Ambulatory Visit: Payer: Self-pay | Admitting: Family Medicine

## 2023-09-24 LAB — HIV ANTIBODY (ROUTINE TESTING W REFLEX)
HIV 1&2 Ab, 4th Generation: NONREACTIVE
HIV FINAL INTERPRETATION: NEGATIVE

## 2023-09-24 LAB — HEPATITIS C ANTIBODY: Hepatitis C Ab: NONREACTIVE

## 2023-10-19 ENCOUNTER — Other Ambulatory Visit: Payer: Self-pay | Admitting: Obstetrics and Gynecology

## 2023-10-19 DIAGNOSIS — Z1231 Encounter for screening mammogram for malignant neoplasm of breast: Secondary | ICD-10-CM

## 2024-01-13 ENCOUNTER — Ambulatory Visit
Admission: RE | Admit: 2024-01-13 | Discharge: 2024-01-13 | Disposition: A | Discharge status: Home / Self Care | Source: Ambulatory Visit | Attending: Obstetrics and Gynecology | Admitting: Obstetrics and Gynecology

## 2024-01-13 DIAGNOSIS — Z1231 Encounter for screening mammogram for malignant neoplasm of breast: Secondary | ICD-10-CM

## 2024-09-20 ENCOUNTER — Encounter: Admitting: Family Medicine
